# Patient Record
Sex: Male | Born: 1990 | Race: White | Hispanic: No | Marital: Single | Smoking: Former smoker
Health system: Southern US, Community
[De-identification: ages and names within clinical notes are randomized; demographics above are authoritative.]

## PROBLEM LIST (undated history)

## (undated) DIAGNOSIS — S32009A Unspecified fracture of unspecified lumbar vertebra, initial encounter for closed fracture: Secondary | ICD-10-CM

## (undated) DIAGNOSIS — J301 Allergic rhinitis due to pollen: Secondary | ICD-10-CM

## (undated) HISTORY — PX: HERNIA REPAIR: SHX51

## (undated) HISTORY — DX: Unspecified fracture of unspecified lumbar vertebra, initial encounter for closed fracture: S32.009A

## (undated) HISTORY — DX: Allergic rhinitis due to pollen: J30.1

---
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
## 2010-12-30 LAB — CULTURE, ROUTINE-ABSCESS

## 2013-03-27 DIAGNOSIS — S32009A Unspecified fracture of unspecified lumbar vertebra, initial encounter for closed fracture: Secondary | ICD-10-CM

## 2013-03-27 HISTORY — DX: Unspecified fracture of unspecified lumbar vertebra, initial encounter for closed fracture: S32.009A

## 2013-05-08 ENCOUNTER — Emergency Department (HOSPITAL_COMMUNITY)
Admission: EM | Admit: 2013-05-08 | Discharge: 2013-05-09 | Disposition: A | Discharge status: Home / Self Care | Payer: Worker's Compensation | Attending: Emergency Medicine | Admitting: Emergency Medicine

## 2013-05-08 ENCOUNTER — Encounter (HOSPITAL_COMMUNITY): Payer: Self-pay | Admitting: Emergency Medicine

## 2013-05-08 ENCOUNTER — Emergency Department (HOSPITAL_COMMUNITY): Payer: Worker's Compensation

## 2013-05-08 DIAGNOSIS — IMO0002 Reserved for concepts with insufficient information to code with codable children: Secondary | ICD-10-CM | POA: Insufficient documentation

## 2013-05-08 DIAGNOSIS — W240XXA Contact with lifting devices, not elsewhere classified, initial encounter: Secondary | ICD-10-CM | POA: Insufficient documentation

## 2013-05-08 DIAGNOSIS — Y9389 Activity, other specified: Secondary | ICD-10-CM | POA: Diagnosis not present

## 2013-05-08 DIAGNOSIS — Y9289 Other specified places as the place of occurrence of the external cause: Secondary | ICD-10-CM | POA: Diagnosis not present

## 2013-05-08 DIAGNOSIS — Z23 Encounter for immunization: Secondary | ICD-10-CM | POA: Insufficient documentation

## 2013-05-08 DIAGNOSIS — Y990 Civilian activity done for income or pay: Secondary | ICD-10-CM | POA: Insufficient documentation

## 2013-05-08 DIAGNOSIS — W2209XA Striking against other stationary object, initial encounter: Secondary | ICD-10-CM | POA: Diagnosis not present

## 2013-05-08 DIAGNOSIS — R310 Gross hematuria: Secondary | ICD-10-CM | POA: Diagnosis not present

## 2013-05-08 DIAGNOSIS — S20229A Contusion of unspecified back wall of thorax, initial encounter: Secondary | ICD-10-CM | POA: Diagnosis not present

## 2013-05-08 MED ORDER — OXYCODONE-ACETAMINOPHEN 5-325 MG PO TABS
1.0000 | ORAL_TABLET | Freq: Once | ORAL | Status: AC
  Administered 2013-05-09: 1 via ORAL
  Filled 2013-05-08: qty 1

## 2013-05-08 NOTE — ED Provider Notes (Signed)
CSN: [id number]     Arrival date & time 05/08/13  2224 History  This chart was scribed for non-physician practitioner, Felicie Morn, NP, working with Sunnie Nielsen, MD by Shari Heritage, ED Scribe. This patient was seen in room TR07C/TR07C and the patient's care was started at 11:28 PM.    Chief Complaint  Patient presents with  . Back Pain    Patient is a 23 y.o. male presenting with back pain.  Back Pain Location:  Lumbar spine Pain severity:  Severe Onset quality:  Sudden Duration: a few hours ago, this evening. Timing:  Constant Progression:  Unchanged Chronicity:  New Context: occupational injury   Worsened by:  Ambulation and palpation Associated symptoms: no numbness and no weakness     HPI Comments: Jon Harvey is a 23 y.o. male who presents to the Emergency Department complaining of moderate to severe, constant lower back pain onset a few hours ago. 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 He was not entrapped after falling. Pain is worse with weight bearing, ambulation, palpation of his back and certain positions. He denies hematuria, bladder or bowel incontinence, leg pain or any other symptoms at this time. He has no chronic medical conditions. He does not take any medicines on a regular basis. Patient does not smoke.   History reviewed. No pertinent past medical history. History reviewed. No pertinent past surgical history. No family history on file. History  Substance Use Topics  . Smoking status: Never Smoker   . Smokeless tobacco: Not on file  . Alcohol Use: No    Review of Systems  Genitourinary: Negative for hematuria.  Musculoskeletal: Positive for back pain.  Neurological: Negative for weakness and numbness.  All other systems reviewed and are negative.    Allergies  Sulfa antibiotics  Home Medications   Current Outpatient Rx  Name  Route  Sig   Dispense  Refill  . acetaminophen (TYLENOL) 325 MG tablet   Oral   Take 650 mg by mouth every 6 (six) hours as needed for moderate pain.         Marland Kitchen ibuprofen (ADVIL,MOTRIN) 200 MG tablet   Oral   Take 200 mg by mouth every 6 (six) hours as needed for moderate pain.          Triage Vitals: BP 126/63  Pulse 85  Temp(Src) 98.2 F (36.8 C) (Oral)  Resp 20  SpO2 98% Physical Exam  Nursing note and vitals reviewed. Constitutional: He is oriented to person, place, and time. He appears well-developed and well-nourished. No distress.  HENT:  Head: Normocephalic and atraumatic.  Eyes: EOM are normal.  Neck: Neck supple. No tracheal deviation present.  Cardiovascular: Normal rate.   Pulmonary/Chest: Effort normal. No respiratory distress.  Musculoskeletal: Normal range of motion.       Arms: Abrasion to the left lower back.  Neurological: He is alert and oriented to person, place, and time.  Skin: Skin is warm and dry. Abrasion noted.  Psychiatric: He has a normal mood and affect. His behavior is normal.    ED Course  Procedures (including critical care time) DIAGNOSTIC STUDIES: Oxygen Saturation is 98% on room air, normal by my interpretation.    COORDINATION OF CARE: 11:33 PM- Patient informed of current plan for treatment and evaluation and agrees with plan at this time.     Labs  Review Labs Reviewed - No data to display   Imaging Review No results found.  EKG Interpretation   None       MDM  Patient tender along left iliac crest.  Pelvic film without indication of bony injury.  Urine positive for blood.  Will obtain CT A/P with IV contrast to evaluate renal system. Final diagnoses:  None     Patient signed out to Earley FavorGail Schulz, NP and discussed with Dr. Dierdre Highmanpitz at shift change--CT pending.  I personally performed the services described in this documentation, which was scribed in my presence. The recorded information has been reviewed and is  accurate.   Jimmye Normanavid John Shaindel Sweeten, NP 05/09/13 0230

## 2013-05-08 NOTE — ED Notes (Signed)
Pt. accidentally hit his left lower back against  a steel beam while at work this evening , presents with abrasion and reddness at left lower back , no hematuria , pain with palpation and certain positions .

## 2013-05-09 ENCOUNTER — Emergency Department (HOSPITAL_COMMUNITY): Payer: Worker's Compensation

## 2013-05-09 ENCOUNTER — Encounter (HOSPITAL_COMMUNITY): Payer: Self-pay | Admitting: Radiology

## 2013-05-09 LAB — POCT I-STAT, CHEM 8
BUN: 13 mg/dL (ref 6–23)
CALCIUM ION: 1.18 mmol/L (ref 1.12–1.23)
CHLORIDE: 102 meq/L (ref 96–112)
Creatinine, Ser: 0.9 mg/dL (ref 0.50–1.35)
GLUCOSE: 107 mg/dL — AB (ref 70–99)
HEMATOCRIT: 46 % (ref 39.0–52.0)
Hemoglobin: 15.6 g/dL (ref 13.0–17.0)
Potassium: 3.4 mEq/L — ABNORMAL LOW (ref 3.7–5.3)
Sodium: 141 mEq/L (ref 137–147)
TCO2: 24 mmol/L (ref 0–100)

## 2013-05-09 LAB — URINALYSIS, ROUTINE W REFLEX MICROSCOPIC
Bilirubin Urine: NEGATIVE
Glucose, UA: NEGATIVE mg/dL
Ketones, ur: 15 mg/dL — AB
LEUKOCYTES UA: NEGATIVE
NITRITE: NEGATIVE
PH: 7 (ref 5.0–8.0)
Protein, ur: 30 mg/dL — AB
SPECIFIC GRAVITY, URINE: 1.014 (ref 1.005–1.030)
UROBILINOGEN UA: 0.2 mg/dL (ref 0.0–1.0)

## 2013-05-09 LAB — URINE MICROSCOPIC-ADD ON

## 2013-05-09 MED ORDER — MORPHINE SULFATE 4 MG/ML IJ SOLN
4.0000 mg | Freq: Once | INTRAMUSCULAR | Status: AC
  Administered 2013-05-09: 4 mg via INTRAVENOUS
  Filled 2013-05-09: qty 1

## 2013-05-09 MED ORDER — ONDANSETRON HCL 4 MG/2ML IJ SOLN
4.0000 mg | Freq: Once | INTRAMUSCULAR | Status: AC
  Administered 2013-05-09: 4 mg via INTRAVENOUS
  Filled 2013-05-09: qty 2

## 2013-05-09 MED ORDER — IOHEXOL 300 MG/ML  SOLN
100.0000 mL | Freq: Once | INTRAMUSCULAR | Status: AC | PRN
  Administered 2013-05-09: 100 mL via INTRAVENOUS

## 2013-05-09 MED ORDER — TETANUS-DIPHTH-ACELL PERTUSSIS 5-2.5-18.5 LF-MCG/0.5 IM SUSP
0.5000 mL | Freq: Once | INTRAMUSCULAR | Status: AC
  Administered 2013-05-09: 0.5 mL via INTRAMUSCULAR
  Filled 2013-05-09: qty 0.5

## 2013-05-09 MED ORDER — OXYCODONE-ACETAMINOPHEN 5-325 MG PO TABS: 1.0000 | ORAL_TABLET | Freq: Four times a day (QID) | ORAL | Status: DC | PRN

## 2013-05-09 NOTE — ED Provider Notes (Signed)
Medical screening examination/treatment/procedure(s) were performed by non-physician practitioner and as supervising physician I was immediately available for consultation/collaboration.   Damarko Stitely, MD 05/09/13 0707 

## 2013-05-09 NOTE — Discharge Instructions (Signed)
Contusion °A contusion is a deep bruise. Contusions happen when an injury causes bleeding under the skin. Signs of bruising include pain, puffiness (swelling), and discolored skin. The contusion may turn blue, purple, or yellow. °HOME CARE  °· Put ice on the injured area. °· Put ice in a plastic bag. °· Place a towel between your skin and the bag. °· Leave the ice on for 15-20 minutes, 03-04 times a day. °· Only take medicine as told by your doctor. °· Rest the injured area. °· If possible, raise (elevate) the injured area to lessen puffiness. °GET HELP RIGHT AWAY IF:  °· You have more bruising or puffiness. °· You have pain that is getting worse. °· Your puffiness or pain is not helped by medicine. °MAKE SURE YOU:  °· Understand these instructions. °· Will watch your condition. °· Will get help right away if you are not doing well or get worse. °Document Released: 08/30/2007 Document Revised: 06/05/2011 Document Reviewed: 01/16/2011 °ExitCare® Patient Information ©2014 [employer]. ° °Cryotherapy °Cryotherapy means treatment with cold. Ice or gel packs can be used to reduce both pain and swelling. Ice is the most helpful within the first 24 to 48 hours after an injury or flareup from overusing a muscle or joint. Sprains, strains, spasms, burning pain, shooting pain, and aches can all be eased with ice. Ice can also be used when recovering from surgery. Ice is effective, has very few side effects, and is safe for most people to use. °PRECAUTIONS  °Ice is not a safe treatment option for people with: °· Raynaud's phenomenon. This is a condition affecting small blood vessels in the extremities. Exposure to cold may cause your problems to return. °· Cold hypersensitivity. There are many forms of cold hypersensitivity, including: °· Cold urticaria. Red, itchy hives appear on the skin when the tissues begin to warm after being iced. °· Cold erythema. This is a red, itchy rash caused by exposure to cold. °· Cold  hemoglobinuria. Red blood cells break down when the tissues begin to warm after being iced. The hemoglobin that carry oxygen are passed into the urine because they cannot combine with blood proteins fast enough. °· Numbness or altered sensitivity in the area being iced. °If you have any of the following conditions, do not use ice until you have discussed cryotherapy with your caregiver: °· Heart conditions, such as arrhythmia, angina, or chronic heart disease. °· High blood pressure. °· Healing wounds or open skin in the area being iced. °· Current infections. °· Rheumatoid arthritis. °· Poor circulation. °· Diabetes. °Ice slows the blood flow in the region it is applied. This is beneficial when trying to stop inflamed tissues from spreading irritating chemicals to surrounding tissues. However, if you expose your skin to cold temperatures for too long or without the proper protection, you can damage your skin or nerves. Watch for signs of skin damage due to cold. °HOME CARE INSTRUCTIONS °Follow these tips to use ice and cold packs safely. °· Place a dry or damp towel between the ice and skin. A damp towel will cool the skin more quickly, so you may need to shorten the time that the ice is used. °· For a more rapid response, add gentle compression to the ice. °· Ice for no more than 10 to 20 minutes at a time. The bonier the area you are icing, the less time it will take to get the benefits of ice. °· Check your skin after 5 minutes to make sure   there are no signs of a poor response to cold or skin damage.  Rest 20 minutes or more in between uses.  Once your skin is numb, you can end your treatment. You can test numbness by very lightly touching your skin. The touch should be so light that you do not see the skin dimple from the pressure of your fingertip. When using ice, most people will feel these normal sensations in this order: cold, burning, aching, and numbness.  Do not use ice on someone who cannot  communicate their responses to pain, such as small children or people with dementia. HOW TO MAKE AN ICE PACK Ice packs are the most common way to use ice therapy. Other methods include ice massage, ice baths, and cryo-sprays. Muscle creams that cause a cold, tingly feeling do not offer the same benefits that ice offers and should not be used as a substitute unless recommended by your caregiver. To make an ice pack, do one of the following:  Place crushed ice or a bag of frozen vegetables in a sealable plastic bag. Squeeze out the excess air. Place this bag inside another plastic bag. Slide the bag into a pillowcase or place a damp towel between your skin and the bag.  Mix 3 parts water with 1 part rubbing alcohol. Freeze the mixture in a sealable plastic bag. When you remove the mixture from the freezer, it will be slushy. Squeeze out the excess air. Place this bag inside another plastic bag. Slide the bag into a pillowcase or place a damp towel between your skin and the bag. SEEK MEDICAL CARE IF:  You develop white spots on your skin. This may give the skin a blotchy (mottled) appearance.  Your skin turns blue or pale.  Your skin becomes waxy or hard.  Your swelling gets worse. MAKE SURE YOU:   Understand these instructions.  Will watch your condition.  Will get help right away if you are not doing well or get worse. Document Released: 11/07/2010 Document Revised: 06/05/2011 Document Reviewed: 11/07/2010 [facility] Patient Information 2014 [place]Care, [place]. Your CT scan does not show any injury to the kidney.  Please monitor your heart try to drink plenty of fluids if you develop increased pain, weakness, and dizziness, persistent or worsening bleeding.  Please return immediately to the emergency department for further evaluation.  Otherwise, I would like you to call Dr. Isabel CapriceGrapey,  the urologist to set an appointment for further evaluation.  In 1-2 days

## 2013-05-09 NOTE — ED Provider Notes (Signed)
Medical screening examination/treatment/procedure(s) were performed by non-physician practitioner and as supervising physician I was immediately available for consultation/collaboration.   Sunnie NielsenBrian Catalina Salasar, MD 05/09/13 [phone]

## 2013-05-09 NOTE — ED Notes (Signed)
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 Pt states that he was pinned in his lower back and is having pain their now. Pt also noticed blood in his urine.

## 2013-05-09 NOTE — ED Provider Notes (Signed)
Patient's CT scan was reviewed.  Does not reveal any injury to the kidney.  This was discussed with the patient.  He will be discharged home with prescription for Percocet.  Strict instructions to followup with urology if he develops new worsening.  Symptoms, dizziness, lightheadedness, nausea, vomiting, persistent, increased bleeding, has been instructed to return immediately to the emergency room for further evaluation  Arman FilterGail K Darrelyn Morro, NP 05/09/13 [phone]

## 2013-06-28 ENCOUNTER — Emergency Department (HOSPITAL_COMMUNITY)
Admission: EM | Admit: 2013-06-28 | Discharge: 2013-06-28 | Disposition: A | Discharge status: Home / Self Care | Payer: Self-pay | Attending: Emergency Medicine | Admitting: Emergency Medicine

## 2013-06-28 ENCOUNTER — Encounter (HOSPITAL_COMMUNITY): Payer: Self-pay | Admitting: Emergency Medicine

## 2013-06-28 DIAGNOSIS — R000 Tachycardia, unspecified: Secondary | ICD-10-CM | POA: Insufficient documentation

## 2013-06-28 DIAGNOSIS — Y939 Activity, unspecified: Secondary | ICD-10-CM | POA: Insufficient documentation

## 2013-06-28 DIAGNOSIS — F17200 Nicotine dependence, unspecified, uncomplicated: Secondary | ICD-10-CM | POA: Insufficient documentation

## 2013-06-28 DIAGNOSIS — Y929 Unspecified place or not applicable: Secondary | ICD-10-CM | POA: Insufficient documentation

## 2013-06-28 DIAGNOSIS — T401X1A Poisoning by heroin, accidental (unintentional), initial encounter: Secondary | ICD-10-CM | POA: Insufficient documentation

## 2013-06-28 DIAGNOSIS — T401X4A Poisoning by heroin, undetermined, initial encounter: Secondary | ICD-10-CM | POA: Insufficient documentation

## 2013-06-28 DIAGNOSIS — G8929 Other chronic pain: Secondary | ICD-10-CM | POA: Insufficient documentation

## 2013-06-28 DIAGNOSIS — F1110 Opioid abuse, uncomplicated: Secondary | ICD-10-CM | POA: Insufficient documentation

## 2013-06-28 DIAGNOSIS — F1210 Cannabis abuse, uncomplicated: Secondary | ICD-10-CM | POA: Insufficient documentation

## 2013-06-28 LAB — COMPREHENSIVE METABOLIC PANEL
ALBUMIN: 3.9 g/dL (ref 3.5–5.2)
ALK PHOS: 86 U/L (ref 39–117)
ALT: 18 U/L (ref 0–53)
AST: 22 U/L (ref 0–37)
BUN: 13 mg/dL (ref 6–23)
CHLORIDE: 101 meq/L (ref 96–112)
CO2: 26 mEq/L (ref 19–32)
CREATININE: 1.14 mg/dL (ref 0.50–1.35)
Calcium: 8.6 mg/dL (ref 8.4–10.5)
GFR calc Af Amer: >90 mL/min (ref >90)
GFR calc non Af Amer: 89 mL/min — ABNORMAL LOW (ref >90)
Glucose, Bld: 198 mg/dL — ABNORMAL HIGH (ref 70–99)
POTASSIUM: 3.9 meq/L (ref 3.7–5.3)
Sodium: 143 mEq/L (ref 137–147)
TOTAL PROTEIN: 7.1 g/dL (ref 6.0–8.3)
Total Bilirubin: <0.2 mg/dL — ABNORMAL LOW (ref 0.3–1.2)

## 2013-06-28 LAB — CBC
HEMATOCRIT: 43.5 % (ref 39.0–52.0)
Hemoglobin: 15.8 g/dL (ref 13.0–17.0)
MCH: 32.8 pg (ref 26.0–34.0)
MCHC: 36.3 g/dL — AB (ref 30.0–36.0)
MCV: 90.2 fL (ref 78.0–100.0)
PLATELETS: 238 10*3/uL (ref 150–400)
RBC: 4.82 MIL/uL (ref 4.22–5.81)
RDW: 12.5 % (ref 11.5–15.5)
WBC: 27.1 10*3/uL — AB (ref 4.0–10.5)

## 2013-06-28 LAB — ACETAMINOPHEN LEVEL

## 2013-06-28 LAB — RAPID URINE DRUG SCREEN, HOSP PERFORMED
Amphetamines: NOT DETECTED
BENZODIAZEPINES: NOT DETECTED
Barbiturates: NOT DETECTED
COCAINE: NOT DETECTED
OPIATES: POSITIVE — AB
Tetrahydrocannabinol: POSITIVE — AB

## 2013-06-28 LAB — CBG MONITORING, ED: Glucose-Capillary: 195 mg/dL — ABNORMAL HIGH (ref 70–99)

## 2013-06-28 LAB — SALICYLATE LEVEL: Salicylate Lvl: <2 mg/dL — ABNORMAL LOW (ref 2.8–20.0)

## 2013-06-28 LAB — ETHANOL

## 2013-06-28 MED ORDER — SODIUM CHLORIDE 0.9 % IV SOLN: INTRAVENOUS | Status: DC

## 2013-06-28 NOTE — ED Provider Notes (Signed)
CSN: [id number]     Arrival date & time 06/28/13  0312 History   First MD Initiated Contact with Patient 06/28/13 [phone]     Chief Complaint  Patient presents with  . Drug Overdose     (Consider location/radiation/quality/duration/timing/severity/associated sxs/prior Treatment) HPI History provided by patient. History of chronic back pain. Patient states he has not used heroin in years. He does not have a primary care doctor. He typically goes emergency room and most recently was taking Ultram does not help with back pain. Patient decided he would try heroin tonight. 911 was called after patient stopped breathing after injecting heroin. EMS reports anal respirations, CPR by a bystander, patient was given Narcan IV and nasal trumpet was placed. The time patient arrived to the emergency department he was awake, conversant, admits to heroin overdose, denies any pain or complaints otherwise.  History reviewed. No pertinent past medical history. History reviewed. No pertinent past surgical history. No family history on file. History  Substance Use Topics  . Smoking status: Current Every Day Smoker    Types: Cigarettes  . Smokeless tobacco: Not on file  . Alcohol Use: No    Review of Systems  Constitutional: Negative for fever and chills.  Eyes: Negative for visual disturbance.  Respiratory: Negative for shortness of breath.   Cardiovascular: Negative for chest pain.  Gastrointestinal: Negative for abdominal pain.  Genitourinary: Negative for flank pain.  Musculoskeletal: Negative for back pain.  Skin: Negative for rash.  Neurological: Negative for seizures and weakness.  All other systems reviewed and are negative.      Allergies  Sulfa antibiotics  Home Medications   Current Outpatient Rx  Name  Route  Sig  Dispense  Refill  . cyclobenzaprine (FLEXERIL) 10 MG tablet   Oral   Take 10 mg by mouth at bedtime as needed for muscle spasms.          . traMADol (ULTRAM) 50 MG  tablet   Oral   Take 50 mg by mouth 3 (three) times daily as needed for moderate pain or severe pain.           BP 120/61  Pulse 125  Temp(Src) 97.9 F (36.6 C) (Oral)  Resp 12  Ht 5\' 7"  (1.702 m)  Wt 165 lb (74.844 kg)  BMI 25.84 kg/m2  SpO2 100% Physical Exam  Constitutional: He is oriented to person, place, and time. He appears well-developed and well-nourished.  HENT:  Head: Normocephalic and atraumatic.  Mouth/Throat: Oropharynx is clear and moist.  Eyes: EOM are normal. Pupils are equal, round, and reactive to light.  Neck: Neck supple.  Cardiovascular: Regular rhythm and intact distal pulses.   Tachycardic  Pulmonary/Chest: Effort normal. No respiratory distress.  Abdominal: Soft. He exhibits no distension. There is no tenderness.  Musculoskeletal: Normal range of motion. He exhibits no edema and no tenderness.  Neurological: He is alert and oriented to person, place, and time. No cranial nerve deficit. Coordination normal.  Skin: Skin is warm and dry.    ED Course  Procedures (including critical care time) Labs Review Labs Reviewed  CBC - Abnormal; Notable for the following:    WBC 27.1 (*)    MCHC 36.3 (*)    All other components within normal limits  COMPREHENSIVE METABOLIC PANEL - Abnormal; Notable for the following:    Glucose, Bld 198 (*)    Total Bilirubin <0.2 (*)    GFR calc non Af Amer 89 (*)    All other components  within normal limits  SALICYLATE LEVEL - Abnormal; Notable for the following:    Salicylate Lvl <2.0 (*)    All other components within normal limits  CBG MONITORING, ED - Abnormal; Notable for the following:    Glucose-Capillary 195 (*)    All other components within normal limits  ETHANOL  ACETAMINOPHEN LEVEL  URINE RAPID DRUG SCREEN (HOSP PERFORMED)     EKG Interpretation   Date/Time:  Saturday June 28 2013 03:20:42 EDT Ventricular Rate:  136 PR Interval:  123 QRS Duration: 100 QT Interval:  282 QTC Calculation: 424 R  Axis:   93 Text Interpretation:  Sinus tachycardia Nonspecific ST abnormality  Abnormal ECG No old tracing to compare Confirmed by Sharnise Blough  MD, Dietrich Ke  ([id number]) on 06/28/2013 4:41:22 AM     IV fluids. Cardiac monitoring.  6:43 AM patient has remained awake in the ER, resting comfortably. Family is now bedside. Patient family requesting outpatient resources and also requesting to be discharged home. Patient remains somewhat tachycardic and continues to deny any complaints. Lab work reviewed as above does have leukocytosis. No acute abdomen. No pulmonary or urinary complaints. He agrees to return precautions for any infectious symptoms, pain complaints or concerning symptoms.  MDM   Final diagnoses:  Heroin overdose   Patient observed in ER without return of unresponsiveness. EKG and labs reviewed. Serial evaluations. Vital signs and nursing notes reviewed and considered.   Sunnie NielsenBrian Hammond Obeirne, MD 06/28/13 [phone]

## 2013-06-28 NOTE — ED Notes (Signed)
Pt sleepy, resting with eyes closed head in hand, arousable to voice, interactive, cooperative, follows commands, skin W&D, resps shallow, even unlabored. VSS.

## 2013-06-28 NOTE — ED Notes (Signed)
Alert, NAD, calm, interactive, SPO2 dropping as pt drifts to sleep, [facility], pt given PO fluids, tolerating, placed on [place] 2L. Denies : pain or other sx.

## 2013-06-28 NOTE — ED Notes (Signed)
Pt comes VIA ems for possible heroin overdose.  Pt found on scene laying in parking lot not breathing.  Pt bagged and given 1mg  IV and pt began to respond.  Possible bystander CPR started, no complaints of pain in sternum by pt at this time.  Pt currently talking in clear complete sentences.  Pt admits to heroin use and smoking pot

## 2013-06-28 NOTE — Discharge Instructions (Signed)
Narcotic Overdose A narcotic overdose is the misuse or overuse of a narcotic drug. A narcotic overdose can make you pass out and stop breathing. If you are not treated right away, this can cause permanent brain damage or stop your heart. Medicine may be given to reverse the effects of an overdose. If so, this medicine may bring on withdrawal symptoms. The symptoms may be abdominal cramps, throwing up (vomiting), sweating, chills, and nervousness. Injecting narcotics can cause more problems than just an overdose. AIDS, hepatitis, and other very serious infections are transmitted by sharing needles and syringes. If you decide to quit using, there are medicines which can help you through the withdrawal period. Trying to quit all at once on your own can be uncomfortable, but not life-threatening. Call your caregiver, Narcotics Anonymous, or any drug and alcohol treatment program for further help.  Document Released: 04/20/2004 Document Revised: 06/05/2011 Document Reviewed: 02/12/2009 [facility] Patient Information 2014 [place]ExitCare, [place]LL[place].   [facility] [address], [place], [place] [phone]   [facility] [address], [place], [place]  [phone]   [facility] [address],  [place], [place] [phone] or [phone]   Substance Abuse Resources Organization         Address  Phone  Notes  [facility]  [phone]   [facility]  [phone]   [facility]  [phone]   [facility]  [phone]   [facility]idential [facility] Outpatient Substance Abuse Program  [phone]   Psychological Services Organization         Address  Phone  Notes  [facility]  [phone]   [facility]  [phone]   [facility] [address], [place] [phone] or [phone]    Mobile Crisis Teams Organization         Address  Phone  Notes  [facility]  Crisis Care Unit  [phone]   [facility]  [address] [place], [place] [phone]   Doristine LocksSharon DeEsch [address] [place] [phone]    Self-Help/Support Groups Organization         Address  Phone             Notes  [facility] [place] - variety of support groups  [phone] Call for more information  Narcotics Anonymous (NA), [facility] [address], [place]  2 meetings at this location   [facility] Organization         Address  Phone  Notes  [facility] [address],    [place]  [phone]   [facility]  [address], [place] [phone], [place], [place] [phone]   [facility] [address], [place] [phone] Admissions: 8am-3pm M-F  [facility] [address]    [place], [place] [phone]   [facility] [address], [place], [place] [phone]   [facility] [address]  [place], [place] [phone]   [facility] [address], [place], [place] [phone]   [facility] [address]  [place], [place] [phone] or [phone]   [facility] ([facility] [address] [place], [place] [phone] Accepts Medicaid  [facility] [address]  [place] [phone] Substance Abuse/Addiction Treatment

## 2013-06-28 NOTE — ED Notes (Signed)
No changes, VSS, girlfriend to BS.

## 2015-07-16 IMAGING — CT CT ABD-PELV W/ CM
2 of 4 series · 16 of 46 positions shown, 18 images · IV contrast (APPLIED)
Comparison: DG PELVIS 1-2 VIEWS dated 05/08/2013

CLINICAL DATA: Trauma. Blood in the urine. Crush injury. Low back
pain. Renal injury.

EXAM:
CT ABDOMEN AND PELVIS WITH CONTRAST
TECHNIQUE: Multidetector CT imaging of the abdomen and pelvis was performed
using the standard protocol following bolus administration of
intravenous contrast.
CONTRAST:  100mL OMNIPAQUE IOHEXOL 300 MG/ML  SOLN

[Series 2: abd/ pelvis 5.0 i30f 1 · axial · 0.70mm/px · z∈[+850,+1290]mm · 13 of 96 slices shown, 15 images]
[im 4/96  soft-tissue]
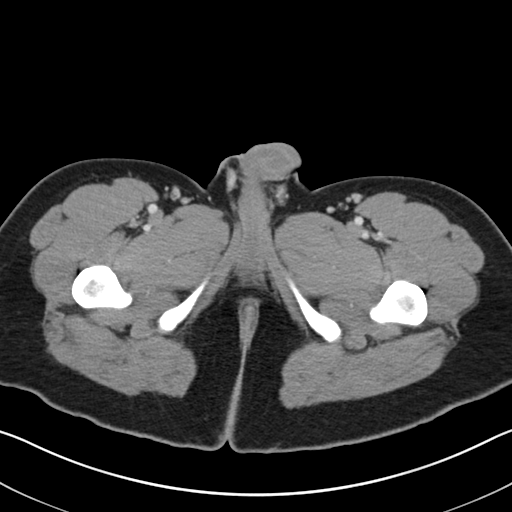
[im 4/96  bone]
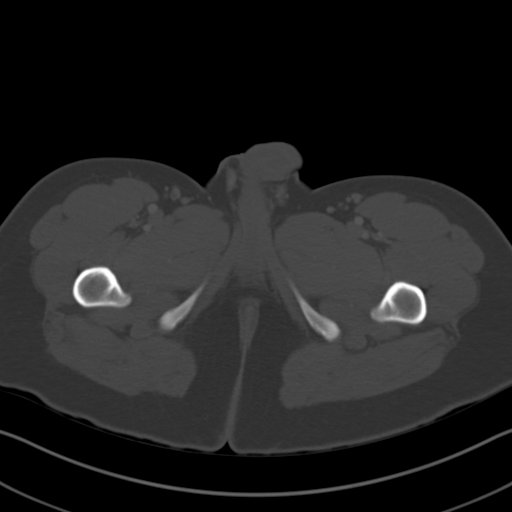
[im 12/96  soft-tissue]
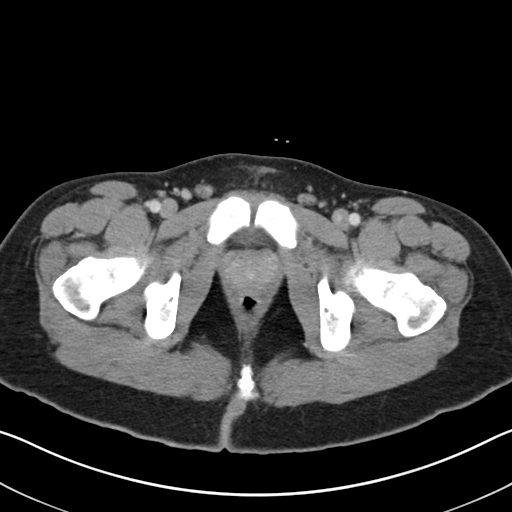
[im 20/96  soft-tissue]
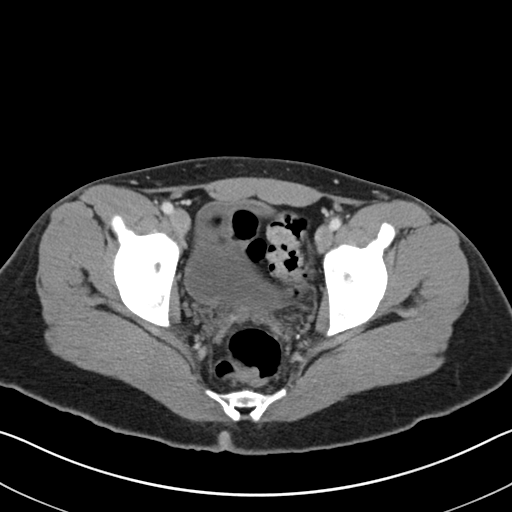
[im 27/96  soft-tissue]
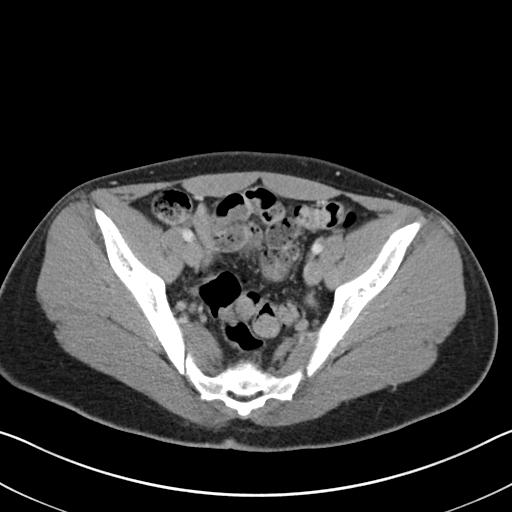
[im 35/96  soft-tissue]
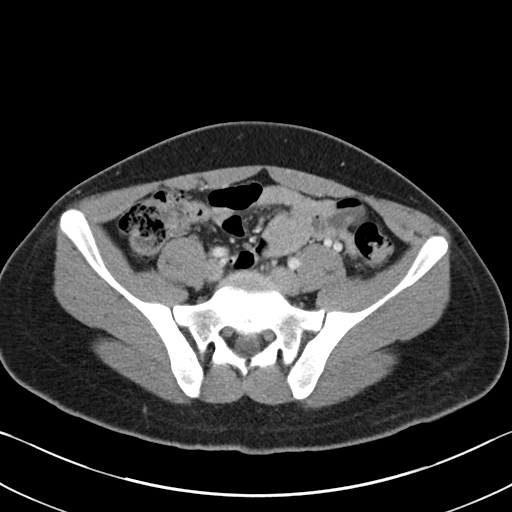
[im 42/96  soft-tissue]
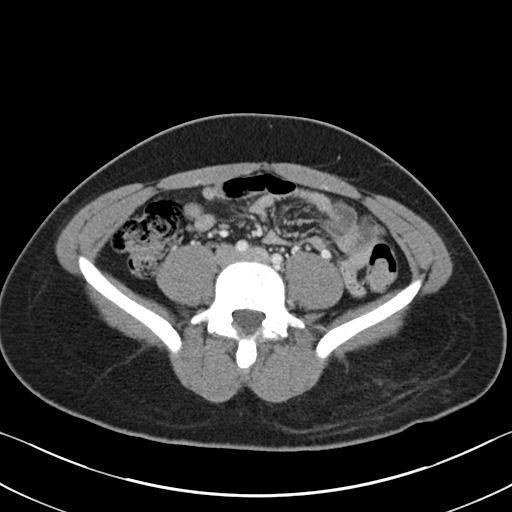
[im 50/96  soft-tissue]
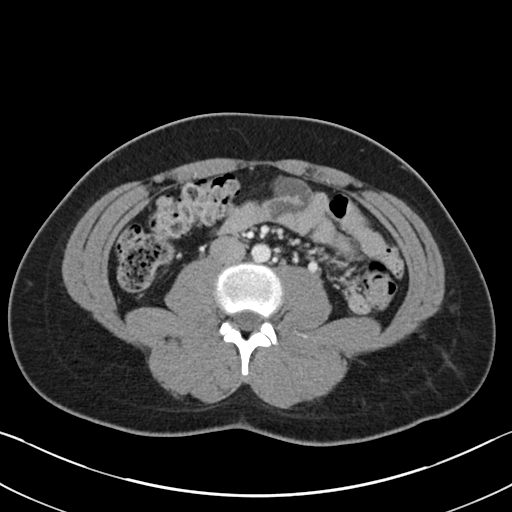
[im 54/96  soft-tissue]
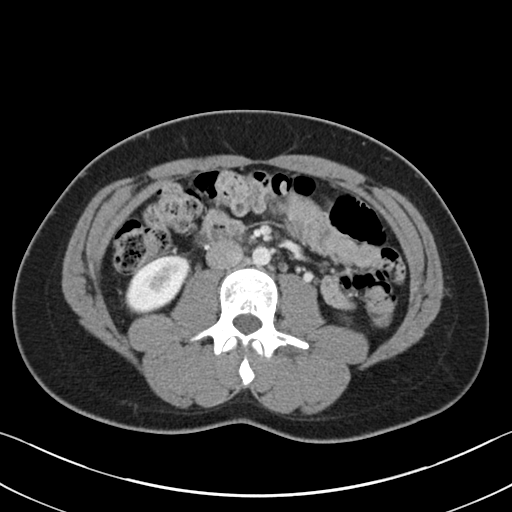
[im 61/96  soft-tissue]
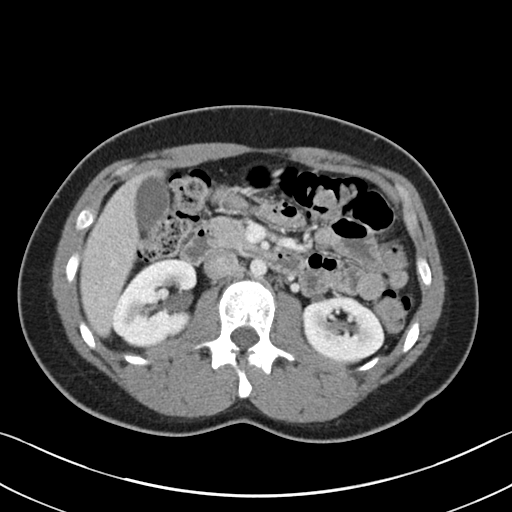
[im 61/96  bone]
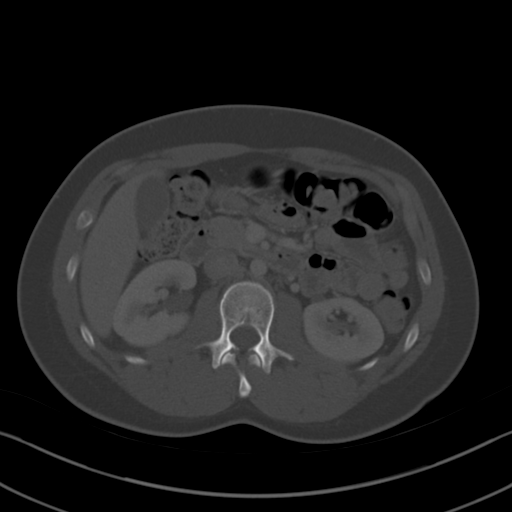
[im 69/96  soft-tissue]
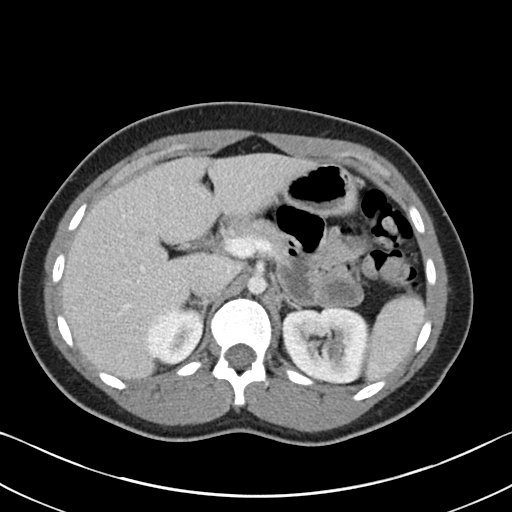
[im 77/96  soft-tissue]
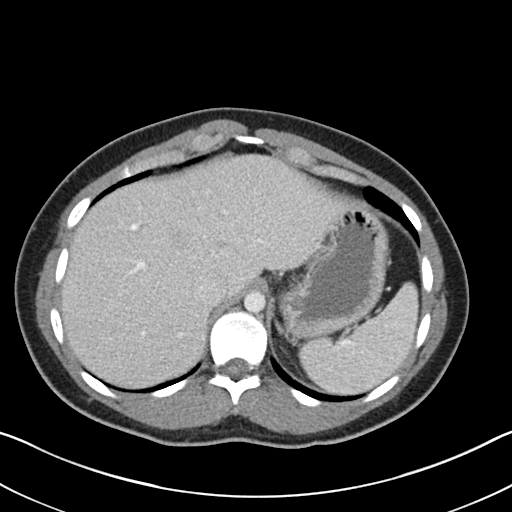
[im 84/96  soft-tissue]
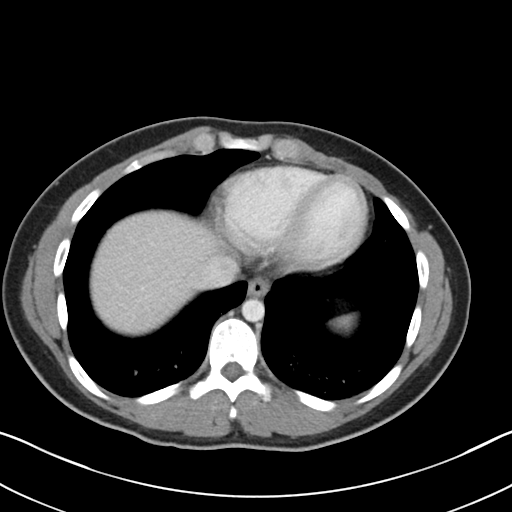
[im 92/96  soft-tissue]
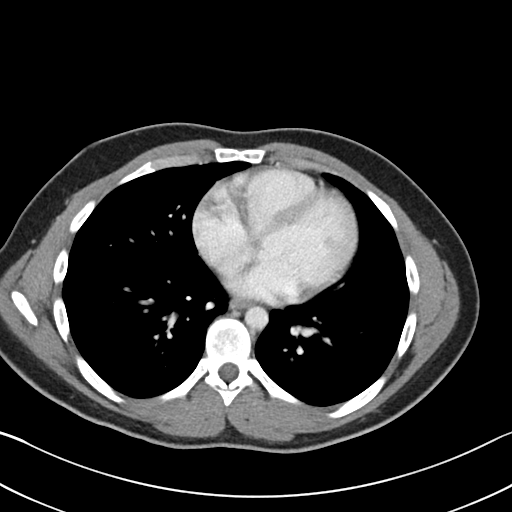

[Series 5: coronals · coronal · 0.70mm/px · 3 of 104 slices shown]
[im 35/104  soft-tissue]
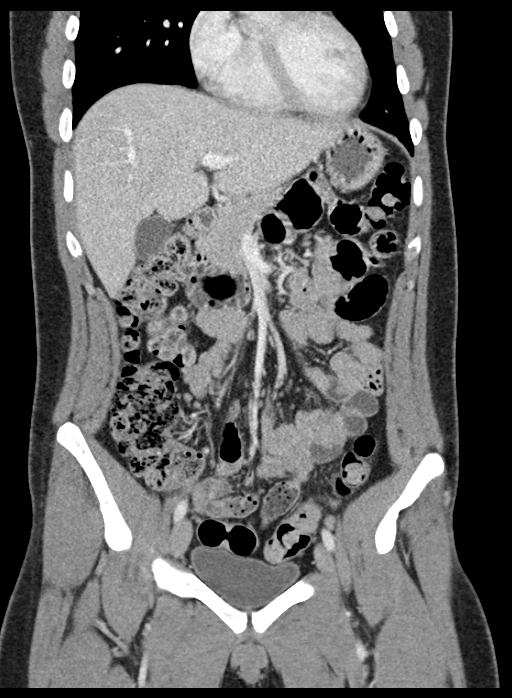
[im 46/104  soft-tissue]
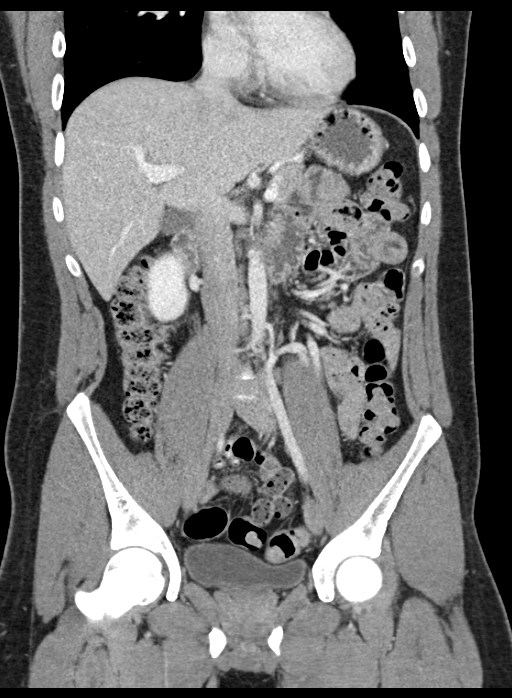
[im 58/104  soft-tissue]
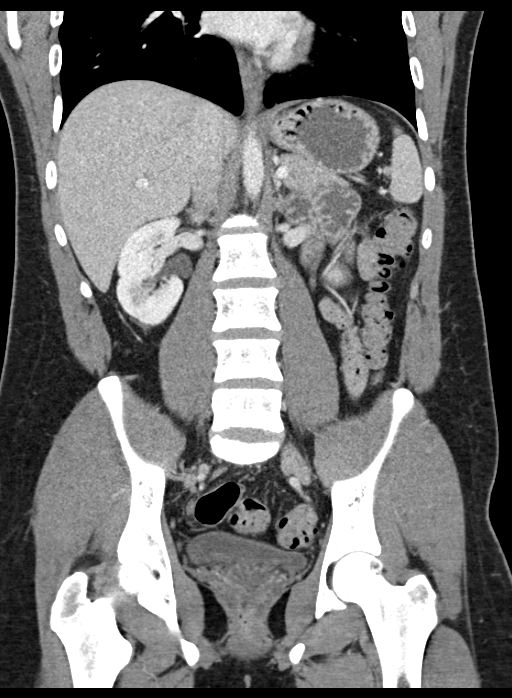

[16 of 46 positions shown; findings below may reference images not displayed]

FINDINGS: Lung Bases: Dependent atelectasis at the lung bases. No displaced
rib fractures or pneumothorax identified.

Liver:  Normal.

Spleen:  Normal.

Gallbladder:  Normal.

Common bile duct:  Normal.

Pancreas:  Normal.

Adrenal glands:  Normal.

Kidneys: Normal renal enhancement. No evidence of contusion or
perinephric hemorrhage. Both ureters appear within normal limits.

Stomach:  Normal.

Small bowel:  Normal.

Colon:   Normal.

Pelvic Genitourinary: Urinary bladder normal. No free fluid in the
anatomic pelvis.

Bones: Lumbar spinal alignment is anatomic. There is no fracture.
Lumbosacral junction appears normal.

Vasculature: Normal.

Body Wall: Normal.
IMPRESSION: Normal CT abdomen and pelvis. Specifically, no renal injury is
identified.

## 2017-03-01 ENCOUNTER — Telehealth: Payer: Self-pay | Admitting: Internal Medicine

## 2017-03-01 NOTE — Telephone Encounter (Signed)
Copied from CRM [id number]. Topic: Appointment Scheduling - Scheduling Inquiry for Clinic >> Mar 01, 2017  4:06 PM Stephannie LiSimmons, Quin Mcpherson L, [place] wrote: Reason for [id number]:Patient called again to establish care with a male PCP please advise [phone]

## 2017-03-02 NOTE — Telephone Encounter (Signed)
Appointment 12/19

## 2017-03-14 ENCOUNTER — Ambulatory Visit: Payer: Self-pay | Admitting: Family Medicine

## 2017-07-16 ENCOUNTER — Ambulatory Visit
Admission: EM | Admit: 2017-07-16 | Discharge: 2017-07-16 | Disposition: A | Discharge status: Home / Self Care | Payer: Managed Care, Other (non HMO) | Attending: Family Medicine | Admitting: Family Medicine

## 2017-07-16 DIAGNOSIS — J029 Acute pharyngitis, unspecified: Secondary | ICD-10-CM

## 2017-07-16 LAB — RAPID STREP SCREEN (MED CTR MEBANE ONLY): Streptococcus, Group A Screen (Direct): NEGATIVE

## 2017-07-16 NOTE — ED Triage Notes (Signed)
Reporting about 3 days worth of cough, sore throat, and associated symptoms.   Slowly getting worse in throat.  "ears pop" when swallowing.   Reports productive cough with clear "mucousy".  Ambulatory to triage. In NAD.

## 2017-07-19 LAB — CULTURE, GROUP A STREP (THRC)

## 2017-08-13 ENCOUNTER — Encounter: Payer: Self-pay | Admitting: Internal Medicine

## 2017-08-13 ENCOUNTER — Ambulatory Visit (INDEPENDENT_AMBULATORY_CARE_PROVIDER_SITE_OTHER): Payer: Managed Care, Other (non HMO) | Admitting: Internal Medicine

## 2017-08-13 VITALS — BP 112/68 | HR 76 | Temp 98.4°F | Ht 66.5 in | Wt 140.0 lb

## 2017-08-13 DIAGNOSIS — N469 Male infertility, unspecified: Secondary | ICD-10-CM

## 2017-08-13 DIAGNOSIS — Z0000 Encounter for general adult medical examination without abnormal findings: Secondary | ICD-10-CM

## 2017-08-13 LAB — COMPREHENSIVE METABOLIC PANEL
ALK PHOS: 55 U/L (ref 39–117)
ALT: 14 U/L (ref 0–53)
AST: 16 U/L (ref 0–37)
Albumin: 4.3 g/dL (ref 3.5–5.2)
BUN: 14 mg/dL (ref 6–23)
CALCIUM: 9.1 mg/dL (ref 8.4–10.5)
CO2: 27 mEq/L (ref 19–32)
CREATININE: 0.78 mg/dL (ref 0.40–1.50)
Chloride: 105 mEq/L (ref 96–112)
GFR: 126.7 mL/min (ref >60.00)
Glucose, Bld: 99 mg/dL (ref 70–99)
Potassium: 3.7 mEq/L (ref 3.5–5.1)
Sodium: 141 mEq/L (ref 135–145)
TOTAL PROTEIN: 7.1 g/dL (ref 6.0–8.3)
Total Bilirubin: 0.5 mg/dL (ref 0.2–1.2)

## 2017-08-13 LAB — LIPID PANEL
Cholesterol: 177 mg/dL (ref 0–200)
HDL: 77.9 mg/dL (ref >39.00)
LDL Cholesterol: 90 mg/dL (ref 0–99)
NonHDL: 98.96
Total CHOL/HDL Ratio: 2
Triglycerides: 45 mg/dL (ref 0.0–149.0)
VLDL: 9 mg/dL (ref 0.0–40.0)

## 2017-08-13 LAB — CBC
HCT: 43.9 % (ref 39.0–52.0)
Hemoglobin: 15.1 g/dL (ref 13.0–17.0)
MCHC: 34.3 g/dL (ref 30.0–36.0)
MCV: 92.2 fl (ref 78.0–100.0)
Platelets: 256 10*3/uL (ref 150.0–400.0)
RBC: 4.76 Mil/uL (ref 4.22–5.81)
RDW: 12.6 % (ref 11.5–15.5)
WBC: 7.2 10*3/uL (ref 4.0–10.5)

## 2017-08-13 LAB — T4, FREE: Free T4: 0.73 ng/dL (ref 0.60–1.60)

## 2017-08-13 NOTE — Progress Notes (Signed)
Subjective:    Patient ID: Jon Harvey, male    DOB: 12/13/1990, 27 y.o.   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 Multiple Vitamin (ONE-A-DAY MENS PO) Take by mouth.     No current facility-administered medications on file prior to visit.     Allergies  Allergen Reactions  . Sulfa Antibiotics Hives and Swelling    History reviewed. No pertinent past medical history.  Past Surgical History:  Procedure Laterality Date  . HERNIA REPAIR     55 months of age (?near scrotum)    History reviewed. No pertinent family history.  Social History   Socioeconomic History  . Marital status: Single    Spouse name: Not on file  . Number of children: 1  . Years of education: Not on file  . Highest education level: Not on file  Occupational History  . Occupation: Naval architect work    Comment: Pharmacist, community  Social Needs  . Financial resource strain: Not on file  . Food insecurity:    Worry: Not on file    Inability: Not on file  . Transportation needs:    Medical: Not on file    Non-medical: Not on file  Tobacco Use  . Smoking status: Former Games developer  . Smokeless tobacco: Never Used  Substance and Sexual Activity  . Alcohol use: No  . Drug use: No  . Sexual activity: Not on file  Lifestyle  . Physical activity:    Days per week: Not on file    Minutes per session: Not on file  . Stress: Not on file  Relationships  . Social connections:   Talks on phone: Not on file    Gets together: Not on file    Attends religious service: Not on file    Active member of club or organization: Not on file    Attends meetings of clubs or organizations: Not on file    Relationship status: Not on file  . Intimate partner violence:    Fear of current or ex partner: Not on file    Emotionally abused: Not on file    Physically abused: Not on file    Forced sexual activity: Not on file  Other Topics Concern  . Not on file  Social History Narrative   In relationship since 2010.   Planning to marry   1 daughter --born 2011   Review of Systems  Constitutional: Negative for fatigue and unexpected weight change.       Wears seat belt  HENT: Negative for dental problem, hearing loss, tinnitus and trouble swallowing.        Keeps up with dentist  Eyes: Negative for visual disturbance.       No diplopia or unilateral vision loss  Respiratory: Negative for cough, chest tightness and shortness of breath.   Cardiovascular: Negative for chest pain, palpitations and leg swelling.  Gastrointestinal: Negative for abdominal pain, blood in stool and constipation.  No heartburn   Endocrine: Negative for polydipsia and polyuria.  Genitourinary: Negative for difficulty urinating and urgency.       Premature ejaculation at times  Musculoskeletal: Negative for arthralgias, back pain and joint swelling.  Skin: Negative for rash.  Allergic/Immunologic: Positive for environmental allergies. Negative for immunocompromised state.       Uses OTC meds with success  Neurological: Negative for dizziness, syncope, light-headedness and headaches.  Hematological: Does not bruise/bleed easily.       Tends to get swollen cervical nodes with illness  Psychiatric/Behavioral: Negative for dysphoric mood and sleep disturbance.       Some anxiety--- will tend to chew his gums       Objective:   Physical Exam  Constitutional: He is oriented to person, place, and  time. He appears well-developed. No distress.  HENT:  Head: Normocephalic.  Right Ear: External ear normal.  Mouth/Throat: Oropharynx is clear and moist. No oropharyngeal exudate.  Eyes: Pupils are equal, round, and reactive to light. Conjunctivae are normal.  Neck: Normal range of motion. No thyromegaly present.  Cardiovascular: Normal rate, regular rhythm, normal heart sounds and intact distal pulses. Exam reveals no friction rub.  No murmur heard. Pulmonary/Chest: Effort normal and breath sounds normal. No respiratory distress. He has no wheezes. He has no rales.  Abdominal: Soft. There is no tenderness.  Genitourinary:  Genitourinary Comments: Normal testes   Musculoskeletal: He exhibits no edema or tenderness.  Lymphadenopathy:    He has no cervical adenopathy.  Neurological: He is alert and oriented to person, place, and time.  Skin: Skin is warm. No pallor.  Psychiatric: He has a normal mood and affect. His behavior is normal.          Assessment & Plan:

## 2017-08-13 NOTE — Assessment & Plan Note (Signed)
Will check labs Discussed fertility specialist (but fiancee without insurance now) Could go to urologist prn for sperm count

## 2017-08-13 NOTE — Assessment & Plan Note (Signed)
Healthy Recommended flu vaccine yearly Does exercise

## 2017-08-14 LAB — HIV ANTIBODY (ROUTINE TESTING W REFLEX): HIV 1&2 Ab, 4th Generation: NONREACTIVE

## 2017-08-22 NOTE — ED Provider Notes (Signed)
[facility]    CSN: [id number] Arrival date & time: 07/16/17  1738     History   Chief Complaint Chief Complaint  Patient presents with  . Sore Throat  . Cough    HPI Jon Harvey is a 27 y.o. male.   The history is provided by the patient.  Sore Throat  This is a new problem.  Cough  Associated symptoms: sore throat   Associated symptoms: no wheezing   URI  Presenting symptoms: cough and sore throat   Severity:  Moderate Onset quality:  Sudden Timing:  Constant Progression:  Unchanged Chronicity:  New Relieved by:  None tried Ineffective treatments:  None tried Associated symptoms: no sinus pain and no wheezing   Risk factors: sick contacts   Risk factors: not elderly, no chronic cardiac disease, no chronic kidney disease, no chronic respiratory disease, no diabetes mellitus, no immunosuppression, no recent illness and no recent travel     Past Medical History:  Diagnosis Date  . Allergic rhinitis due to pollen   . Lumbar vertebral fracture (HCC) 2015   L1-2 in work accident    Patient Active Problem List   Diagnosis Date Noted  . Preventative health care 08/13/2017  . 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 HERNIA REPAIR     73 months of age (?near scrotum)       Home Medications    Prior to Admission medications   Medication Sig Start Date End Date Taking? Authorizing Provider  Multiple Vitamin (ONE-A-DAY MENS PO) Take by mouth.    [provider]    Family History Family History  Problem Relation Age of Onset  . Asthma Mother   . Hyperlipidemia Mother   . Hypertension Mother   . Arthritis Mother   . Alcohol abuse Father   . Leukemia Sister        in remission  . Diabetes Maternal Grandfather   . Heart disease Neg Hx     Social History Social History   Tobacco Use  . Smoking status: Former Games developer  . Smokeless tobacco: Never Used  Substance Use Topics  .  Alcohol use: No  . Drug use: No     Allergies   Sulfa antibiotics   Review of Systems Review of Systems  HENT: Positive for sore throat. Negative for sinus pain.   Respiratory: Positive for cough. Negative for wheezing.      Physical Exam Triage Vital Signs ED Triage Vitals  Enc Vitals Group     BP 07/16/17 1750 111/72     Pulse Rate 07/16/17 1750 96     Resp 07/16/17 1750 16     Temp 07/16/17 1750 98.2 F (36.8 C)     Temp Source 07/16/17 1750 Oral     SpO2 07/16/17 1750 100 %     Weight --      Height --      Head Circumference --      Peak Flow --      Pain Score 07/16/17 1754 6     Pain Loc --      Pain Edu? --      Excl. in GC? --    No data found.  Updated Vital Signs BP 111/72 (BP Location: Right Arm)   Pulse 96   Temp 98.2 F (36.8 C) (Oral)   Resp 16   SpO2 100%   Visual Acuity Right Eye Distance:  Left Eye Distance:   Bilateral Distance:    Right Eye Near:   Left Eye Near:    Bilateral Near:     Physical Exam  Constitutional: He appears well-developed and well-nourished. No distress.  HENT:  Head: Normocephalic and atraumatic.  Right Ear: Tympanic membrane, external ear and ear canal normal.  Left Ear: Tympanic membrane, external ear and ear canal normal.  Nose: Nose normal.  Mouth/Throat: Uvula is midline and mucous membranes are normal. Posterior oropharyngeal erythema present. No oropharyngeal exudate, posterior oropharyngeal edema or tonsillar abscesses. No tonsillar exudate.  Eyes: Pupils are equal, round, and reactive to light. Conjunctivae and EOM are normal. Right eye exhibits no discharge. Left eye exhibits no discharge. No scleral icterus.  Neck: Normal range of motion. Neck supple. No tracheal deviation present. No thyromegaly present.  Cardiovascular: Normal rate, regular rhythm and normal heart sounds.  Pulmonary/Chest: Effort normal and breath sounds normal. No stridor. No respiratory distress. He has no wheezes. He has no  rales. He exhibits no tenderness.  Lymphadenopathy:    He has no cervical adenopathy.  Neurological: He is alert.  Skin: Skin is warm and dry. No rash noted. He is not diaphoretic.  Nursing note and vitals reviewed.    UC Treatments / Results  Labs (all labs ordered are listed, but only abnormal results are displayed) Labs Reviewed  RAPID STREP SCREEN ([facility] ONLY)  CULTURE, GROUP A STREP [facility])    EKG None  Radiology No results found.  Procedures Procedures (including critical care time)  Medications Ordered in UC Medications - No data to display  Initial Impression / Assessment and Plan / UC Course  I have reviewed the triage vital signs and the nursing notes.  Pertinent labs & imaging results that were available during my care of the patient were reviewed by me and considered in my medical decision making (see chart for details).      Final Clinical Impressions(s) / UC Diagnoses   Final diagnoses:  Viral pharyngitis    ED Prescriptions    None     1. Lab results and diagnosis reviewed with patient 2. Recommend supportive treatment with rest, fluids, salt water gargles, otc analgesics 3. Follow-up prn if symptoms worsen or don't improve   Controlled Substance Prescriptions [place] Controlled Substance Registry consulted? Not Applicable   Payton Mccallum, MD 08/22/17 [phone]

## 2019-07-31 ENCOUNTER — Ambulatory Visit: Payer: Managed Care, Other (non HMO) | Attending: Internal Medicine
